# Patient Record
Sex: Female | Born: 1990 | Race: White | Hispanic: No | Marital: Married
Health system: Southern US, Community
[De-identification: ages and names within clinical notes are randomized; demographics above are authoritative.]

## PROBLEM LIST (undated history)

## (undated) DIAGNOSIS — O24419 Gestational diabetes mellitus in pregnancy, unspecified control: Secondary | ICD-10-CM

## (undated) DIAGNOSIS — F419 Anxiety disorder, unspecified: Secondary | ICD-10-CM

## (undated) DIAGNOSIS — O139 Gestational [pregnancy-induced] hypertension without significant proteinuria, unspecified trimester: Secondary | ICD-10-CM

## (undated) HISTORY — PX: TONSILLECTOMY: SUR1361

## (undated) HISTORY — DX: Gestational (pregnancy-induced) hypertension without significant proteinuria, unspecified trimester: O13.9

---

## 2010-07-16 ENCOUNTER — Ambulatory Visit: Payer: Self-pay | Admitting: Diagnostic Radiology

## 2010-07-16 ENCOUNTER — Emergency Department (HOSPITAL_BASED_OUTPATIENT_CLINIC_OR_DEPARTMENT_OTHER): Admission: EM | Admit: 2010-07-16 | Discharge: 2010-07-16 | Payer: Self-pay | Admitting: Emergency Medicine

## 2020-02-09 ENCOUNTER — Ambulatory Visit: Payer: Self-pay | Attending: Internal Medicine

## 2020-02-09 DIAGNOSIS — Z20822 Contact with and (suspected) exposure to covid-19: Secondary | ICD-10-CM

## 2020-02-10 LAB — NOVEL CORONAVIRUS, NAA: SARS-CoV-2, NAA: NOT DETECTED

## 2020-07-03 LAB — OB RESULTS CONSOLE GC/CHLAMYDIA
Chlamydia: NEGATIVE
Gonorrhea: NEGATIVE

## 2020-07-03 LAB — OB RESULTS CONSOLE RUBELLA ANTIBODY, IGM: Rubella: IMMUNE

## 2020-07-03 LAB — OB RESULTS CONSOLE ANTIBODY SCREEN: Antibody Screen: NEGATIVE

## 2020-07-03 LAB — OB RESULTS CONSOLE RPR: RPR: NONREACTIVE

## 2020-07-03 LAB — OB RESULTS CONSOLE HIV ANTIBODY (ROUTINE TESTING): HIV: NONREACTIVE

## 2020-07-03 LAB — OB RESULTS CONSOLE ABO/RH: RH Type: POSITIVE

## 2020-07-03 LAB — OB RESULTS CONSOLE HEPATITIS B SURFACE ANTIGEN: Hepatitis B Surface Ag: NEGATIVE

## 2020-12-06 ENCOUNTER — Ambulatory Visit: Payer: BC Managed Care – PPO

## 2020-12-09 NOTE — L&D Delivery Note (Signed)
Delivery Note At bedside for increased pressure, found to have made rapid change to complete, +1. At 1:23 AM a viable female was delivered via Vaginal, Spontaneous (Presentation:   Occiput Anterior). Tight nuchal noted and delivered through. APGAR: 8, 9; weight pend .   Placenta status: Spontaneous, Intact.  Cord: 3 vessels with the following complications: None.  Uterus explored and cleared of clot. Atony noted and resolved with bimanual massage.  1000 mcg cytotec place PR.  Anesthesia: Epidural Episiotomy: None Lacerations: None Est. Blood Loss (mL): 300  Mom to postpartum.  Baby to Couplet care / Skin to Skin.  Lyn Henri 01/22/2021, 1:47 AM

## 2020-12-11 ENCOUNTER — Encounter: Payer: Self-pay | Admitting: Registered"

## 2020-12-11 ENCOUNTER — Encounter: Payer: BC Managed Care – PPO | Attending: Obstetrics and Gynecology | Admitting: Registered"

## 2020-12-11 DIAGNOSIS — Z3A30 30 weeks gestation of pregnancy: Secondary | ICD-10-CM | POA: Insufficient documentation

## 2020-12-11 DIAGNOSIS — O24419 Gestational diabetes mellitus in pregnancy, unspecified control: Secondary | ICD-10-CM | POA: Diagnosis not present

## 2020-12-11 NOTE — Progress Notes (Signed)
Virtual Visit via Video Note  I connected with Julie Goodman on 12/11/20 at  8:00 AM EST by a video enabled telemedicine application and verified that I am speaking with the correct person using two identifiers. Location: Patient: home Provider: [facility]   I discussed the limitations of evaluation and management by telemedicine and the availability of in person appointments. The patient expressed understanding and agreed to proceed.  Patient was seen on 12/11/20 for Gestational Diabetes self-management via [place]. Due to technical difficulties the visit was switch to phone call. The following learning objectives were met by the patient:   States the definition of Gestational Diabetes  States why dietary management is important in controlling blood glucose  Describes the effects each nutrient has on blood glucose levels  Demonstrates ability to create a balanced meal plan  Demonstrates carbohydrate counting   States when to check blood glucose levels  States proper blood glucose monitoring techniques  States the effect of stress and exercise on blood glucose levels  States the importance of limiting caffeine and abstaining from alcohol and smoking  Blood glucose monitor given: none - virtual appointment  Patient instructed to monitor glucose levels: FBS: 60 - <95; 1 hour: <140; 2 hour: <120  Patient received handouts via email:  Nutrition Diabetes and Pregnancy, including carb counting list  Patient will be seen for follow-up as needed.

## 2021-01-08 LAB — OB RESULTS CONSOLE GBS: GBS: NEGATIVE

## 2021-01-15 ENCOUNTER — Other Ambulatory Visit: Payer: Self-pay

## 2021-01-15 ENCOUNTER — Encounter (HOSPITAL_COMMUNITY): Payer: Self-pay

## 2021-01-15 ENCOUNTER — Inpatient Hospital Stay (HOSPITAL_COMMUNITY)
Admission: AD | Admit: 2021-01-15 | Discharge: 2021-01-15 | Disposition: A | Discharge status: Home / Self Care | Payer: BC Managed Care – PPO | Attending: Obstetrics and Gynecology | Admitting: Obstetrics and Gynecology

## 2021-01-15 DIAGNOSIS — Z79899 Other long term (current) drug therapy: Secondary | ICD-10-CM | POA: Diagnosis not present

## 2021-01-15 DIAGNOSIS — O99891 Other specified diseases and conditions complicating pregnancy: Secondary | ICD-10-CM

## 2021-01-15 DIAGNOSIS — O9A213 Injury, poisoning and certain other consequences of external causes complicating pregnancy, third trimester: Secondary | ICD-10-CM | POA: Diagnosis not present

## 2021-01-15 DIAGNOSIS — W000XXA Fall on same level due to ice and snow, initial encounter: Secondary | ICD-10-CM | POA: Insufficient documentation

## 2021-01-15 DIAGNOSIS — R109 Unspecified abdominal pain: Secondary | ICD-10-CM | POA: Diagnosis present

## 2021-01-15 DIAGNOSIS — O24415 Gestational diabetes mellitus in pregnancy, controlled by oral hypoglycemic drugs: Secondary | ICD-10-CM | POA: Insufficient documentation

## 2021-01-15 DIAGNOSIS — W010XXA Fall on same level from slipping, tripping and stumbling without subsequent striking against object, initial encounter: Secondary | ICD-10-CM

## 2021-01-15 DIAGNOSIS — Z3689 Encounter for other specified antenatal screening: Secondary | ICD-10-CM | POA: Diagnosis not present

## 2021-01-15 DIAGNOSIS — Z3A36 36 weeks gestation of pregnancy: Secondary | ICD-10-CM

## 2021-01-15 DIAGNOSIS — Z7982 Long term (current) use of aspirin: Secondary | ICD-10-CM | POA: Diagnosis not present

## 2021-01-15 DIAGNOSIS — W19XXXA Unspecified fall, initial encounter: Secondary | ICD-10-CM

## 2021-01-15 HISTORY — DX: Gestational (pregnancy-induced) hypertension without significant proteinuria, unspecified trimester: O13.9

## 2021-01-15 HISTORY — DX: Gestational diabetes mellitus in pregnancy, unspecified control: O24.419

## 2021-01-15 LAB — URINALYSIS, ROUTINE W REFLEX MICROSCOPIC
Bilirubin Urine: NEGATIVE
Glucose, UA: NEGATIVE mg/dL
Hgb urine dipstick: NEGATIVE
Ketones, ur: NEGATIVE mg/dL
Leukocytes,Ua: NEGATIVE
Nitrite: NEGATIVE
Protein, ur: NEGATIVE mg/dL
Specific Gravity, Urine: 1.012 (ref 1.005–1.030)
pH: 6 (ref 5.0–8.0)

## 2021-01-15 NOTE — MAU Note (Signed)
Julie Goodman is a 30 y.o. at [redacted]w[redacted]d here in MAU reporting: this morning slipped on the ice while leaving the house. States she did not hit her abdomen but did land on her back. Is having some pelvic pressure but states this has been ongoing and is not new. After the fall she had some cramping but that has stopped, denies bleeding or LOF. +FM. Is on labetalol for GHTN and is scheduled for induction next week.  Onset of complaint: today  Pain score: 0/10  Vitals:   01/15/21 0918  BP: 131/77  Pulse: 91  Resp: 16  Temp: 98.8 F (37.1 C)  SpO2: 100%     FHT: +FM  Lab orders placed from triage: UA

## 2021-01-15 NOTE — MAU Provider Note (Signed)
History     CSN: [id number]  Arrival date and time: 01/15/21 9379   Event Date/Time   First Provider Initiated Contact with Patient 01/15/21 [phone]      Chief Complaint  Patient presents with  . Fall   HPI This is a 30yo G2P1001 at [redacted]w[redacted]d with pregnancy complicated by GDM. She is seen today after slipping on ice and falling at 8am. She had her hands full and was unable to catch herself. She landed on her back and side. No abdominal trauma, bruising, bleeding. Reports good fetal movement.   OB History    Gravida  2   Para  1   Term      Preterm      AB      Living  1     SAB      IAB      Ectopic      Multiple      Live Births  1           Past Medical History:  Diagnosis Date  . Gestational diabetes   . Gestational hypertension     Past Surgical History:  Procedure Laterality Date  . TONSILLECTOMY      Family History  Problem Relation Age of Onset  . Diabetes Father   . Breast cancer Maternal Grandmother   . Heart disease Maternal Grandfather     Social History   Tobacco Use  . Smoking status: Never Smoker  . Smokeless tobacco: Never Used  Vaping Use  . Vaping Use: Never used  Substance Use Topics  . Alcohol use: Not Currently  . Drug use: Never    Allergies:  Allergies  Allergen Reactions  . Penicillins Hives    Medications Prior to Admission  Medication Sig Dispense Refill Last Dose  . Aspirin 81 MG CAPS Take by mouth.   01/14/2021 at Unknown time  . glyBURIDE (DIABETA) 2.5 MG tablet Take 2.5 mg by mouth at bedtime.   01/14/2021 at Unknown time  . labetalol (NORMODYNE) 100 MG tablet Take 100 mg by mouth 2 (two) times daily.   01/15/2021 at Unknown time  . Prenatal Vit-Fe Fumarate-FA (MULTIVITAMIN-PRENATAL) 27-0.8 MG TABS tablet Take 1 tablet by mouth daily at 12 noon.   01/14/2021 at Unknown time  . sertraline (ZOLOFT) 100 MG tablet Take 100 mg by mouth daily.   01/14/2021 at Unknown time    Review of Systems  All other systems reviewed  and are negative.  Physical Exam   Blood pressure 135/77, pulse 90, temperature 98.8 F (37.1 C), temperature source Oral, resp. rate 18, height 5\' 2"  (1.575 m), weight 99.4 kg, SpO2 97 %.  Physical Exam Vitals reviewed.  Constitutional:      Appearance: Normal appearance.  HENT:     Head: Normocephalic and atraumatic.  Cardiovascular:     Rate and Rhythm: Normal rate and regular rhythm.     Pulses: Normal pulses.     Heart sounds: Normal heart sounds.  Pulmonary:     Effort: Pulmonary effort is normal.     Breath sounds: Normal breath sounds.  Abdominal:     General: Abdomen is flat. Bowel sounds are normal. There is no distension.     Palpations: Abdomen is soft. There is no mass.     Tenderness: There is no abdominal tenderness. There is no guarding or rebound.  Skin:    General: Skin is warm and dry.     Capillary Refill: Capillary refill takes less than  2 seconds.  Neurological:     General: No focal deficit present.     Mental Status: She is alert.  Psychiatric:        Mood and Affect: Mood normal.        Behavior: Behavior normal.        Thought Content: Thought content normal.        Judgment: Judgment normal.     MAU Course  Procedures NST: 130, moderate variability, + accels, no decels. No contractions.  MDM   Assessment and Plan  1. [redacted] weeks gestation of pregnancy  2. NST (non-stress test) reactive  3. Fall, initial encounter  Discharge to home. Return precautions given.  Has [place] later today.  Levie Heritage 01/15/2021, 9:56 AM

## 2021-01-17 ENCOUNTER — Telehealth (HOSPITAL_COMMUNITY): Payer: Self-pay | Admitting: *Deleted

## 2021-01-17 ENCOUNTER — Encounter (HOSPITAL_COMMUNITY): Payer: Self-pay | Admitting: *Deleted

## 2021-01-17 NOTE — Telephone Encounter (Signed)
Preadmission screen  

## 2021-01-21 ENCOUNTER — Other Ambulatory Visit: Payer: Self-pay

## 2021-01-21 ENCOUNTER — Inpatient Hospital Stay (HOSPITAL_COMMUNITY): Payer: BC Managed Care – PPO | Admitting: Anesthesiology

## 2021-01-21 ENCOUNTER — Inpatient Hospital Stay (HOSPITAL_COMMUNITY)
Admission: AD | Admit: 2021-01-21 | Discharge: 2021-01-23 | DRG: 807 | Disposition: A | Discharge status: Home / Self Care | Payer: BC Managed Care – PPO | Attending: Obstetrics and Gynecology | Admitting: Obstetrics and Gynecology

## 2021-01-21 ENCOUNTER — Encounter (HOSPITAL_COMMUNITY): Payer: Self-pay | Admitting: Obstetrics and Gynecology

## 2021-01-21 DIAGNOSIS — Z3689 Encounter for other specified antenatal screening: Secondary | ICD-10-CM | POA: Diagnosis not present

## 2021-01-21 DIAGNOSIS — O24415 Gestational diabetes mellitus in pregnancy, controlled by oral hypoglycemic drugs: Secondary | ICD-10-CM

## 2021-01-21 DIAGNOSIS — Z20822 Contact with and (suspected) exposure to covid-19: Secondary | ICD-10-CM | POA: Diagnosis present

## 2021-01-21 DIAGNOSIS — O134 Gestational [pregnancy-induced] hypertension without significant proteinuria, complicating childbirth: Secondary | ICD-10-CM | POA: Diagnosis present

## 2021-01-21 DIAGNOSIS — O24425 Gestational diabetes mellitus in childbirth, controlled by oral hypoglycemic drugs: Secondary | ICD-10-CM | POA: Diagnosis present

## 2021-01-21 DIAGNOSIS — Z3A37 37 weeks gestation of pregnancy: Secondary | ICD-10-CM

## 2021-01-21 DIAGNOSIS — O169 Unspecified maternal hypertension, unspecified trimester: Secondary | ICD-10-CM | POA: Diagnosis present

## 2021-01-21 DIAGNOSIS — O133 Gestational [pregnancy-induced] hypertension without significant proteinuria, third trimester: Secondary | ICD-10-CM | POA: Diagnosis not present

## 2021-01-21 DIAGNOSIS — O24419 Gestational diabetes mellitus in pregnancy, unspecified control: Secondary | ICD-10-CM

## 2021-01-21 HISTORY — DX: Anxiety disorder, unspecified: F41.9

## 2021-01-21 LAB — COMPREHENSIVE METABOLIC PANEL
ALT: 21 U/L (ref 0–44)
AST: 18 U/L (ref 15–41)
Albumin: 2.9 g/dL — ABNORMAL LOW (ref 3.5–5.0)
Alkaline Phosphatase: 237 U/L — ABNORMAL HIGH (ref 38–126)
Anion gap: 12 (ref 5–15)
BUN: 6 mg/dL (ref 6–20)
CO2: 22 mmol/L (ref 22–32)
Calcium: 9.1 mg/dL (ref 8.9–10.3)
Chloride: 101 mmol/L (ref 98–111)
Creatinine, Ser: 0.53 mg/dL (ref 0.44–1.00)
GFR, Estimated: >60 mL/min (ref >60)
Glucose, Bld: 102 mg/dL — ABNORMAL HIGH (ref 70–99)
Potassium: 4.3 mmol/L (ref 3.5–5.1)
Sodium: 135 mmol/L (ref 135–145)
Total Bilirubin: 0.8 mg/dL (ref 0.3–1.2)
Total Protein: 6.9 g/dL (ref 6.5–8.1)

## 2021-01-21 LAB — CBC
HCT: 39.3 % (ref 36.0–46.0)
HCT: 37.1 % (ref 36.0–46.0)
Hemoglobin: 13.3 g/dL (ref 12.0–15.0)
Hemoglobin: 12.1 g/dL (ref 12.0–15.0)
MCH: 31.4 pg (ref 26.0–34.0)
MCH: 30.3 pg (ref 26.0–34.0)
MCHC: 33.8 g/dL (ref 30.0–36.0)
MCHC: 32.6 g/dL (ref 30.0–36.0)
MCV: 92.9 fL (ref 80.0–100.0)
MCV: 93 fL (ref 80.0–100.0)
Platelets: 202 10*3/uL (ref 150–400)
Platelets: 190 10*3/uL (ref 150–400)
RBC: 4.23 MIL/uL (ref 3.87–5.11)
RBC: 3.99 MIL/uL (ref 3.87–5.11)
RDW: 14.1 % (ref 11.5–15.5)
RDW: 13.9 % (ref 11.5–15.5)
WBC: 7.6 10*3/uL (ref 4.0–10.5)
WBC: 9.4 10*3/uL (ref 4.0–10.5)
nRBC: 0 % (ref 0.0–0.2)
nRBC: 0 % (ref 0.0–0.2)

## 2021-01-21 LAB — RESP PANEL BY RT-PCR (FLU A&B, COVID) ARPGX2
Influenza A by PCR: NEGATIVE
Influenza B by PCR: NEGATIVE
SARS Coronavirus 2 by RT PCR: NEGATIVE

## 2021-01-21 LAB — GLUCOSE, CAPILLARY
Glucose-Capillary: 87 mg/dL (ref 70–99)
Glucose-Capillary: 149 mg/dL — ABNORMAL HIGH (ref 70–99)
Glucose-Capillary: 90 mg/dL (ref 70–99)
Glucose-Capillary: 72 mg/dL (ref 70–99)

## 2021-01-21 LAB — PROTEIN / CREATININE RATIO, URINE
Creatinine, Urine: 48.13 mg/dL
Protein Creatinine Ratio: 0.17 mg/mg{Cre} — ABNORMAL HIGH (ref 0.00–0.15)
Total Protein, Urine: 8 mg/dL

## 2021-01-21 LAB — LACTATE DEHYDROGENASE: LDH: 116 U/L (ref 98–192)

## 2021-01-21 LAB — URIC ACID: Uric Acid, Serum: 4.5 mg/dL (ref 2.5–7.1)

## 2021-01-21 LAB — RPR: RPR Ser Ql: NONREACTIVE

## 2021-01-21 LAB — TYPE AND SCREEN
ABO/RH(D): A POS
Antibody Screen: NEGATIVE

## 2021-01-21 MED ORDER — EPHEDRINE 5 MG/ML INJ: 10.0000 mg | INTRAVENOUS | Status: DC | PRN

## 2021-01-21 MED ORDER — PHENYLEPHRINE 40 MCG/ML (10ML) SYRINGE FOR IV PUSH (FOR BLOOD PRESSURE SUPPORT)
80.0000 ug | PREFILLED_SYRINGE | INTRAVENOUS | Status: DC | PRN
  Filled 2021-01-21: qty 10

## 2021-01-21 MED ORDER — TERBUTALINE SULFATE 1 MG/ML IJ SOLN: 0.2500 mg | Freq: Once | INTRAMUSCULAR | Status: DC | PRN

## 2021-01-21 MED ORDER — OXYTOCIN BOLUS FROM INFUSION: 333.0000 mL | Freq: Once | INTRAVENOUS | Status: DC

## 2021-01-21 MED ORDER — PHENYLEPHRINE 40 MCG/ML (10ML) SYRINGE FOR IV PUSH (FOR BLOOD PRESSURE SUPPORT): 80.0000 ug | PREFILLED_SYRINGE | INTRAVENOUS | Status: DC | PRN

## 2021-01-21 MED ORDER — OXYCODONE-ACETAMINOPHEN 5-325 MG PO TABS: 1.0000 | ORAL_TABLET | ORAL | Status: DC | PRN

## 2021-01-21 MED ORDER — ONDANSETRON HCL 4 MG/2ML IJ SOLN: 4.0000 mg | Freq: Four times a day (QID) | INTRAMUSCULAR | Status: DC | PRN

## 2021-01-21 MED ORDER — LABETALOL HCL 100 MG PO TABS
100.0000 mg | ORAL_TABLET | Freq: Two times a day (BID) | ORAL | Status: DC
  Administered 2021-01-21 (×2): 100 mg via ORAL
  Filled 2021-01-21 (×2): qty 1

## 2021-01-21 MED ORDER — OXYTOCIN-SODIUM CHLORIDE 30-0.9 UT/500ML-% IV SOLN
2.5000 [IU]/h | INTRAVENOUS | Status: DC
  Administered 2021-01-22: 2.5 [IU]/h via INTRAVENOUS

## 2021-01-21 MED ORDER — LACTATED RINGERS IV SOLN: INTRAVENOUS | Status: DC

## 2021-01-21 MED ORDER — OXYCODONE-ACETAMINOPHEN 5-325 MG PO TABS: 2.0000 | ORAL_TABLET | ORAL | Status: DC | PRN

## 2021-01-21 MED ORDER — FENTANYL-BUPIVACAINE-NACL 0.5-0.125-0.9 MG/250ML-% EP SOLN
12.0000 mL/h | EPIDURAL | Status: DC | PRN
  Filled 2021-01-21: qty 250

## 2021-01-21 MED ORDER — OXYTOCIN-SODIUM CHLORIDE 30-0.9 UT/500ML-% IV SOLN
1.0000 m[IU]/min | INTRAVENOUS | Status: DC
  Administered 2021-01-21: 2 m[IU]/min via INTRAVENOUS
  Filled 2021-01-21: qty 500

## 2021-01-21 MED ORDER — MISOPROSTOL 25 MCG QUARTER TABLET
25.0000 ug | ORAL_TABLET | ORAL | Status: DC | PRN
  Administered 2021-01-21 (×2): 25 ug via VAGINAL
  Filled 2021-01-21 (×2): qty 1

## 2021-01-21 MED ORDER — LACTATED RINGERS IV SOLN
500.0000 mL | Freq: Once | INTRAVENOUS | Status: AC
  Administered 2021-01-21: 500 mL via INTRAVENOUS

## 2021-01-21 MED ORDER — BUTORPHANOL TARTRATE 1 MG/ML IJ SOLN: 1.0000 mg | INTRAMUSCULAR | Status: DC | PRN

## 2021-01-21 MED ORDER — SOD CITRATE-CITRIC ACID 500-334 MG/5ML PO SOLN: 30.0000 mL | ORAL | Status: DC | PRN

## 2021-01-21 MED ORDER — LIDOCAINE HCL (PF) 1 % IJ SOLN
INTRAMUSCULAR | Status: DC | PRN
  Administered 2021-01-21: 5 mL via EPIDURAL

## 2021-01-21 MED ORDER — LIDOCAINE HCL (PF) 1 % IJ SOLN: 30.0000 mL | INTRAMUSCULAR | Status: DC | PRN

## 2021-01-21 MED ORDER — DIPHENHYDRAMINE HCL 50 MG/ML IJ SOLN: 12.5000 mg | INTRAMUSCULAR | Status: DC | PRN

## 2021-01-21 MED ORDER — FENTANYL-BUPIVACAINE-NACL 0.5-0.125-0.9 MG/250ML-% EP SOLN
EPIDURAL | Status: DC | PRN
  Administered 2021-01-21: 12 mL/h via EPIDURAL

## 2021-01-21 MED ORDER — SERTRALINE HCL 100 MG PO TABS
100.0000 mg | ORAL_TABLET | Freq: Every day | ORAL | Status: DC
  Administered 2021-01-21: 100 mg via ORAL
  Filled 2021-01-21: qty 1

## 2021-01-21 MED ORDER — LACTATED RINGERS IV SOLN: 500.0000 mL | INTRAVENOUS | Status: DC | PRN

## 2021-01-21 MED ORDER — HYDROXYZINE HCL 50 MG PO TABS: 50.0000 mg | ORAL_TABLET | Freq: Four times a day (QID) | ORAL | Status: DC | PRN

## 2021-01-21 MED ORDER — ACETAMINOPHEN 325 MG PO TABS: 650.0000 mg | ORAL_TABLET | ORAL | Status: DC | PRN

## 2021-01-21 NOTE — Anesthesia Procedure Notes (Signed)

## 2021-01-21 NOTE — MAU Note (Signed)
[name] is a 30 y.o. at [redacted]w[redacted]d here in MAU reporting: lower back and abdominal cramping since last night at 2300, states cramping is constant but sometimes the intensity changes. No bleeding or LOF. +FM  Onset of complaint: last night  Pain score: 3/10  Vitals:   01/21/21 0735  BP: (!) 146/82  Pulse: 93  Resp: 18  Temp: 98.8 F (37.1 C)  SpO2: 100%     Is on labetalol 100mg  2x daily but did not take dose last night or this AM due to pain  FHT: +FM  Lab orders placed from triage: none

## 2021-01-21 NOTE — Anesthesia Preprocedure Evaluation (Signed)
Anesthesia Evaluation  Patient identified by MRN, date of birth, ID band Patient awake    Reviewed: Allergy & Precautions, NPO status , Patient's Chart, lab work & pertinent test results  Airway Mallampati: II  TM Distance: >3 FB Neck ROM: Full    Dental no notable dental hx. (+) Teeth Intact, Dental Advisory Given   Pulmonary neg pulmonary ROS,    Pulmonary exam normal breath sounds clear to auscultation       Cardiovascular hypertension, Pt. on medications Normal cardiovascular exam Rhythm:Regular Rate:Normal     Neuro/Psych Anxiety negative neurological ROS     GI/Hepatic negative GI ROS, Neg liver ROS,   Endo/Other  diabetes  Renal/GU negative Renal ROS     Musculoskeletal   Abdominal (+) + obese,   Peds  Hematology Lab Results      Component                Value               Date                      WBC                      9.4                 01/21/2021                HGB                      12.1                01/21/2021                HCT                      37.1                01/21/2021                MCV                      93.0                01/21/2021                PLT                      190                 01/21/2021              Anesthesia Other Findings   Reproductive/Obstetrics (+) Pregnancy                             Anesthesia Physical Anesthesia Plan  ASA: III  Anesthesia Plan: Epidural   Post-op Pain Management:    Induction:   PONV Risk Score and Plan:   Airway Management Planned:   Additional Equipment:   Intra-op Plan:   Post-operative Plan:   Informed Consent: I have reviewed the patients History and Physical, chart, labs and discussed the procedure including the risks, benefits and alternatives for the proposed anesthesia with the patient or authorized representative who has indicated his/her understanding and acceptance.        Plan Discussed with:  Anesthesia Plan Comments: (37.2 G2P1 w gHth and gDM)        Anesthesia Quick Evaluation

## 2021-01-21 NOTE — H&P (Signed)
OB History and Physical   Julie Goodman is a 30 y.o. female G2P1001 presenting for cramping at [redacted]w[redacted]d.  Pregnancy course is notable for gestational hypertension on 100 mg labetalol BID as well as gestational diabetes, recently started on glyburide. She has a history of 38 week induction for PIH.  She presents with constant, cramping pain overnight.  She denies headache, vision changes, RUQ or epigastric pain.  She missed her evening medications last night due to ongoing pain.  She last took aspirin on 2/11.  Rh positive, GBS negative, baby girl.    OB History    Gravida  2   Para  1   Term      Preterm      AB      Living  1     SAB      IAB      Ectopic      Multiple      Live Births  1          Past Medical History:  Diagnosis Date  . Gestational diabetes    glyburide  . Gestational hypertension   . Pregnancy induced hypertension    Past Surgical History:  Procedure Laterality Date  . TONSILLECTOMY     Family History: family history includes Breast cancer in her maternal grandmother; Diabetes in her father; Heart disease in her maternal grandfather. Social History:  reports that she has never smoked. She has never used smokeless tobacco. She reports previous alcohol use. She reports that she does not use drugs.     Maternal Diabetes: Yes:  Diabetes Type:  Insulin/Medication controlled Genetic Screening: Normal Maternal Ultrasounds/Referrals: Normal Fetal Ultrasounds or other Referrals:  None Maternal Substance Abuse:  No Significant Maternal Medications:  Labetalol, glyburide, ASA Significant Maternal Lab Results:  Group B Strep negative Other Comments:  None  Review of Systems History Dilation: 1 Effacement (%): 50 Station: -3 Exam by:: Marvel Plan RN Blood pressure (!) 151/94, pulse (!) 108, temperature 98.8 F (37.1 C), temperature source Oral, resp. rate 18, height 5\' 2"  (1.575 m), weight 99.3 kg, SpO2 100 %. Exam Physical Exam   Prenatal labs: ABO, Rh: A/Positive/-- (07/26 0000) Antibody: Negative (07/26 0000) Rubella: Immune (07/26 0000) RPR: Nonreactive (07/26 0000)  HBsAg: Negative (07/26 0000)  HIV: Non-reactive (07/26 0000)  GBS:     Assessment/Plan: . Patient has planned IOL scheduled 01/26/21.  Given presentation today after 37 weeks, ongoing pain, continued elevated BP,  I offered IOL today and patient accepts. o Admit to Labor and Delivery for gestational HTN on medications and A2GDM . Cytotec for cervical ripening, followed by pitocin, AROM\ . EFW 86%ile at 35 weeks . HELLP labs on admission . Last ASA81 dose greater than 24 hrs ago . q4 BG checks for A2GDM on glyburide. 01/28/21 Epidural when desired . Anticipate vaginal delivery.   Marland Kitchen 01/21/2021, 9:09 AM

## 2021-01-21 NOTE — MAU Provider Note (Signed)
Event Date/Time  First Provider Initiated Contact with Patient 01/21/21 0815     S: Ms. Julie Goodman is a 30 y.o. G2P1 at [redacted]w[redacted]d  who presents to MAU today complaining cramping since last night. She is unsure if this is ctx. She denies vaginal bleeding. She denies LOF. She reports normal fetal movement. She's been taking Labetalol 100 mg bid since 35 weeks for gHTN, she has not taken this am. She is also A2GDM on Glyburide.   O: BP (!) 151/94   Pulse (!) 108   Temp 98.8 F (37.1 C) (Oral)   Resp 18   Ht 5\' 2"  (1.575 m)   Wt 99.3 kg   SpO2 100%   BMI 40.06 kg/m   Patient Vitals for the past 24 hrs:  BP Temp Temp src Pulse Resp SpO2 Height Weight  01/21/21 0846 (!) 151/94 -- -- (!) 108 -- 100 % -- --  01/21/21 0830 136/89 -- -- 95 -- 99 % -- --  01/21/21 0818 137/84 -- -- (!) 108 -- -- -- --  01/21/21 0801 (!) 143/82 -- -- 94 -- 95 % -- --  01/21/21 0755 (!) 151/92 -- -- 95 -- 99 % -- --  01/21/21 0735 (!) 146/82 98.8 F (37.1 C) Oral 93 18 100 % -- --  01/21/21 0731 -- -- -- -- -- -- 5\' 2"  (1.575 m) 99.3 kg   GENERAL: Well-developed, well-nourished female in no acute distress.  HEAD: Normocephalic, atraumatic.  CHEST: Normal effort of breathing, regular heart rate ABDOMEN: Soft, nontender, gravid GU: Cervical exam:  Dilation: 1 Effacement (%): 50 Cervical Position: Posterior Station: -3 Presentation: Vertex Exam by:: RN Fetal Monitoring: Baseline: 135 Variability: mod Accelerations: + Decelerations: no Contractions: irreg  MDM: Review of prenatal records from [facility] shows pregnancy is complicated by A2GDM, gHTN and hx in last pregnancy, and anxiety. No signs of labor although recommend IOL for gHTN after 37 wks. Dr. [id number] informed of presentation, clinical findings and recommendation and will come discuss with patient.  A: 1. [redacted] weeks gestation of pregnancy   2. Gestational hypertension, third trimester   3. GDM, class A2   4. NST (non-stress test)  reactive    P: Admit to LD for IOL Mngt per Dr. Marvel Plan, [place], CNM 01/21/2021 9:00 AM

## 2021-01-21 NOTE — Progress Notes (Signed)
Labor Progress Note  Patient now comfortable with epidural.  Cervix 6/70/-2, head well applied.  AROM performed in usual fashion with return of clear fluid.  FHT category 1, accels present.  Toco q3 min.  Pitocin currently 12 mU/min.  Continue current management, anticipate vaginal delivery.

## 2021-01-21 NOTE — Progress Notes (Signed)
Labor Progress Note  Patient doing well, now s/p 2x doses cytotec with progression to 3-4/60/-2.  Head is slightly ballotable and AROM deferred.  Will transition to pitocin and titrate 2x2.  Last BG 90. BP well controlled.   FHT cat 1  Planning epidural. Will continue current mgmt and anticipate vaginal delivery  Julie Simmer MD

## 2021-01-22 ENCOUNTER — Encounter (HOSPITAL_COMMUNITY): Payer: Self-pay | Admitting: Obstetrics and Gynecology

## 2021-01-22 LAB — CBC
HCT: 35.2 % — ABNORMAL LOW (ref 36.0–46.0)
Hemoglobin: 11.7 g/dL — ABNORMAL LOW (ref 12.0–15.0)
MCH: 30.8 pg (ref 26.0–34.0)
MCHC: 33.2 g/dL (ref 30.0–36.0)
MCV: 92.6 fL (ref 80.0–100.0)
Platelets: 195 10*3/uL (ref 150–400)
RBC: 3.8 MIL/uL — ABNORMAL LOW (ref 3.87–5.11)
RDW: 13.6 % (ref 11.5–15.5)
WBC: 14.7 10*3/uL — ABNORMAL HIGH (ref 4.0–10.5)
nRBC: 0 % (ref 0.0–0.2)

## 2021-01-22 LAB — GLUCOSE, CAPILLARY: Glucose-Capillary: 99 mg/dL (ref 70–99)

## 2021-01-22 MED ORDER — ACETAMINOPHEN 325 MG PO TABS: 650.0000 mg | ORAL_TABLET | ORAL | Status: DC | PRN

## 2021-01-22 MED ORDER — WITCH HAZEL-GLYCERIN EX PADS: 1.0000 "application " | MEDICATED_PAD | CUTANEOUS | Status: DC | PRN

## 2021-01-22 MED ORDER — SIMETHICONE 80 MG PO CHEW: 80.0000 mg | CHEWABLE_TABLET | ORAL | Status: DC | PRN

## 2021-01-22 MED ORDER — IBUPROFEN 600 MG PO TABS
600.0000 mg | ORAL_TABLET | Freq: Four times a day (QID) | ORAL | Status: DC
  Administered 2021-01-22 – 2021-01-23 (×6): 600 mg via ORAL
  Filled 2021-01-22 (×6): qty 1

## 2021-01-22 MED ORDER — MISOPROSTOL 200 MCG PO TABS
1000.0000 ug | ORAL_TABLET | Freq: Once | ORAL | Status: AC
  Administered 2021-01-22: 1000 ug via RECTAL

## 2021-01-22 MED ORDER — MISOPROSTOL 200 MCG PO TABS
ORAL_TABLET | ORAL | Status: AC
  Filled 2021-01-22: qty 5

## 2021-01-22 MED ORDER — ONDANSETRON HCL 4 MG/2ML IJ SOLN: 4.0000 mg | INTRAMUSCULAR | Status: DC | PRN

## 2021-01-22 MED ORDER — SENNOSIDES-DOCUSATE SODIUM 8.6-50 MG PO TABS
2.0000 | ORAL_TABLET | ORAL | Status: DC
  Filled 2021-01-22: qty 2

## 2021-01-22 MED ORDER — DIPHENHYDRAMINE HCL 25 MG PO CAPS
25.0000 mg | ORAL_CAPSULE | Freq: Four times a day (QID) | ORAL | Status: DC | PRN
Start: 2021-01-22 — End: 2021-01-23

## 2021-01-22 MED ORDER — ONDANSETRON HCL 4 MG PO TABS: 4.0000 mg | ORAL_TABLET | ORAL | Status: DC | PRN

## 2021-01-22 MED ORDER — DIBUCAINE (PERIANAL) 1 % EX OINT: 1.0000 "application " | TOPICAL_OINTMENT | CUTANEOUS | Status: DC | PRN

## 2021-01-22 MED ORDER — BENZOCAINE-MENTHOL 20-0.5 % EX AERO: 1.0000 "application " | INHALATION_SPRAY | CUTANEOUS | Status: DC | PRN

## 2021-01-22 MED ORDER — TETANUS-DIPHTH-ACELL PERTUSSIS 5-2.5-18.5 LF-MCG/0.5 IM SUSY: 0.5000 mL | PREFILLED_SYRINGE | Freq: Once | INTRAMUSCULAR | Status: DC

## 2021-01-22 MED ORDER — PRENATAL MULTIVITAMIN CH
1.0000 | ORAL_TABLET | Freq: Every day | ORAL | Status: DC
  Administered 2021-01-22 – 2021-01-23 (×2): 1 via ORAL
  Filled 2021-01-22 (×2): qty 1

## 2021-01-22 MED ORDER — COCONUT OIL OIL: 1.0000 "application " | TOPICAL_OIL | Status: DC | PRN

## 2021-01-22 MED ORDER — ZOLPIDEM TARTRATE 5 MG PO TABS: 5.0000 mg | ORAL_TABLET | Freq: Every evening | ORAL | Status: DC | PRN

## 2021-01-22 NOTE — Lactation Note (Signed)
This note was copied from a baby's chart. Lactation Consultation Note  Patient Name: Julie Goodman SEGBT'D Date: 01/22/2021 Reason for consult: Follow-up assessment Age:30 hours  LC back to room to set up DEBP per mother's request. DEBP has been proactively set up prior to [facility] follow up visit. Mother states she is collecting more when using manual breast pump.  Mother reports infant is taking formula well with a bottle and EBM available is getting mixed with formula. LC encouraged mother to offer EBM first via finger and/or spoon and then provide formula.  Last feeding infant took ~48mL of formula with Dr. Theora Gianotti nipple. Discussed volumes of supplementation according to age. LC provided guideline for reference.   Feeding plan:  1. Breastfeed following hunger cues.  2. Offer breast 8 - 12 times in 24h period to establish good milk supply.   3. Pump or hand-express and offer EBM prior to formula supplementation. 4. If needed supplement with formula following guidelines, paced bottle feeding and fullness cues.   5. Encouraged maternal rest, hydration and food intake.  6. Contact Lactation Services or local resources for support, questions or concerns.      Feeding Mother's Current Feeding Choice: Breast Milk and Formula  Interventions Interventions: Breast feeding basics reviewed;Expressed milk;Education  Consult Status Consult Status: Follow-up Date: 01/23/21 Follow-up type: In-patient    Geraldo Haris A Higuera Ancidey 01/22/2021, 6:07 PM

## 2021-01-22 NOTE — Social Work (Signed)
MOB was referred for history of anxiety.   * Referral screened out by Clinical Social Worker because none of the following criteria appear to apply:  ~ History of anxiety/depression during this pregnancy, or of post-partum depression following prior delivery. ~ Diagnosis of anxiety and/or depression within last 3 years. OR * MOB's symptoms currently being treated with medication and/or therapy. MOB has an active prescription for Zoloft 100 mg.   Please contact the Clinical Social Worker if needs arise, by MOB request, or if MOB scores greater than 9/yes to question 10 on Edinburgh Postpartum Depression Screen.  Kobie Matkins, LCSWA Clinical Social Work [facility]  [phone] 

## 2021-01-22 NOTE — Lactation Note (Signed)
This note was copied from a baby's chart. Lactation Consultation Note  Patient Name: Julie Goodman Date: 01/22/2021 Reason for consult: Mother's request Age:30 hours   LP called for lactation. Upon entering the room we observed that LP was tearful and concerned. LP stated that she wanted [place] to attempt to spoon feed baby EBM.   LC swaddled baby and assisted LP in sitting baby in an upright position to spoon feed. LS used a gloved finger to try to get baby to suckle, but baby was unable to form a seal and there were little to no rhythmic sucks.   After repeated attempts by [facility] and LS, it was determined that baby was unable to feed at this time.   Julie Goodman 01/22/2021, 10:01 AM

## 2021-01-22 NOTE — Progress Notes (Signed)
Post Partum Day 0 s/p IOL s/s gHTN without severe features.  Subjective: no complaints, up ad lib, voiding and tolerating PO  Objective: Blood pressure 138/76, pulse 87, temperature 98.8 F (37.1 C), temperature source Oral, resp. rate 18, height 5\' 2"  (1.575 m), weight 99.3 kg, SpO2 99 %, unknown if currently breastfeeding.  Physical Exam:  General: alert Lochia: appropriate Uterine Fundus: firm Incision: n/a DVT Evaluation: No evidence of DVT seen on physical exam.  Recent Labs    01/21/21 1844 01/22/21 0402  HGB 12.1 11.7*  HCT 37.1 35.2*    Assessment/Plan: Plan for discharge tomorrow and Breastfeeding. BP normal to mild range.    LOS: 1 day   01/24/21 01/22/2021, 10:13 AM

## 2021-01-22 NOTE — Anesthesia Postprocedure Evaluation (Signed)
Anesthesia Post Note  Patient: Julie Goodman  Procedure(s) Performed: AN AD HOC LABOR EPIDURAL     Patient location during evaluation: Mother Baby Anesthesia Type: Epidural Level of consciousness: awake and alert Pain management: pain level controlled Vital Signs Assessment: post-procedure vital signs reviewed and stable Respiratory status: spontaneous breathing, nonlabored ventilation and respiratory function stable Cardiovascular status: stable Postop Assessment: no headache, no backache and epidural receding Anesthetic complications: no   No complications documented.  Last Vitals:  Vitals:   01/22/21 0415 01/22/21 0803  BP: 124/62 138/76  Pulse: 100 87  Resp: 16 18  Temp: 37.5 C 37.1 C  SpO2:      Last Pain:  Vitals:   01/22/21 0803  TempSrc: Oral  PainSc:    Pain Goal:                   [name]

## 2021-01-22 NOTE — Lactation Note (Signed)
This note was copied from a baby's chart. Lactation Consultation Note  Patient Name: Girl Katniss Weedman Today's Date: 01/22/2021 Reason for consult: Initial assessment;Early term 37-38.6wks Age:30 hours  Maternal Data Has patient been taught Hand Expression?: Yes Does the patient have breastfeeding experience prior to this delivery?: Yes How long did the patient breastfeed?: 6 months     LATCH Score Latch: Grasps breast easily, tongue down, lips flanged, rhythmical sucking. (suckling interrupted by grunting)  Audible Swallowing: None  Type of Nipple: Everted at rest and after stimulation  Comfort (Breast/Nipple): Soft / non-tender  Hold (Positioning): Assistance needed to correctly position infant at breast and maintain latch.  LATCH Score: 7   Lactating parent (LP) was breastfeeding upon entry to the room. Baby was at breast and Lactation Student (LS) asked Lactating Parent how things were going with breastfeeding. LP stated that things were going well.   LS noticed that mom was leaning over to feed baby and that support was needed to help LP become more comfortable. LS assisted LP with repositioning baby and adding support pillows to bring baby closer to the breast. LP stated that she was more comfortable.   Baby latched well, but was unable to sustain the latch due to grunting.   LP stated that baby was grunting during all of her previous feedings as well.   LP stated that her older daughter was born at 19 weeks and she was able to breastfeed her via pumping for 6 months.   LC talked to LP about how early term babies perform at the breast. She informed LP that baby may be more sleepy and tire easily at the breast until her expected due date. LC also informed LP that since baby is grunting at the breast and not transferring well, we should hand express milk to protect her supply and feed baby via a spoon.   LS assisted LP in hand expressing into a vial and educated her on  milk storage guidelines. LS also gave LP a manual pump and taught her how to assemble the pump, use the pump, and how to clean her pump parts. We observed her briefly and size 24 flange is ok at this time.   LP will pump to protect her milk supply.  LC encouraged LP to call for lactation when baby is cueing so that we can assist her with latching and spoon feeding baby.     Lindsi Bayliss 01/22/2021, 9:35 AM

## 2021-01-23 MED ORDER — IBUPROFEN 600 MG PO TABS: 600.0000 mg | ORAL_TABLET | Freq: Four times a day (QID) | ORAL | 0 refills | Status: AC | PRN

## 2021-01-23 MED ORDER — ACETAMINOPHEN 325 MG PO TABS: 650.0000 mg | ORAL_TABLET | Freq: Four times a day (QID) | ORAL | 0 refills | Status: AC | PRN

## 2021-01-23 NOTE — Lactation Note (Signed)
This note was copied from a baby's chart. Lactation Consultation Note  Patient Name: Julie Goodman [id number] Date: 01/23/2021 Reason for consult: Follow-up assessment Age:30 hours   P2 mother whose infant is now 76 hours old.  This is an ETI at 37+3 weeks.  Mother has been breast feeding and has supplemented with formula a couple of times.  Family would like to be discharged home this afternoon.  Mother had baby latched to the breast when I arrived, however, she was sleepy.  Offered to assist/observe with latching and mother agreeable.  Suggested mother remove sleeper and awaken baby.  After baby was more awake mother latched her to the breast again.  I provided helpful advice on how to obtain a deep latch and how to gently stimulate baby to keep her awake.  Mother noticed the change in feeding desire and baby began actively sucking.  Reviewed breast feeding basics and observed baby feeding for 15 minutes.  During this time period, SLP in the room also for observation.  Mother had not been supplementing after every feeding.  Suggested she begin to do this and reviewed the LPTI supplementation guidelines.  Father provided the supplementation while I assisted mother with pumping.  I also suggested mother pump for 15 minutes after every feeding.  Mother willing to do this.  Changed her flange size to a #27 for greater fit and comfort.  Irving Burton, SLP, observed baby feeding and will send up a Dr. Theora Gianotti preemie nipple for parents to use.  Baby had no further grunting or any distress at all during her feeding.  She breast fed well and calmly and consumed 20 mls of formula easily.  Advised parents to feed within the guidelines but to allow her to consume more as desired.  Mother feels relieved and is very happy to see her daughter progressing well.  She is ready for discharge if the pediatrician allows.  Report given to NP who will pass information on to the MD on service.   Maternal Data     Feeding Nipple Type: Dr. Levert Feinstein Preemie  LATCH Score Latch: Grasps breast easily, tongue down, lips flanged, rhythmical sucking.  Audible Swallowing: A few with stimulation  Type of Nipple: Everted at rest and after stimulation  Comfort (Breast/Nipple): Soft / non-tender  Hold (Positioning): Assistance needed to correctly position infant at breast and maintain latch.  LATCH Score: 8   Lactation Tools Discussed/Used    Interventions Interventions: Breast feeding basics reviewed;Assisted with latch;Skin to skin;Breast massage;Hand express;Breast compression;Adjust position;DEBP;Hand pump;Position options;Support pillows;Education  Discharge    Consult Status Consult Status: Follow-up Date: 01/24/21 Follow-up type: In-patient    Zakhai Meisinger R Mikita Lesmeister 01/23/2021, 1:29 PM

## 2021-01-23 NOTE — Discharge Summary (Signed)
Postpartum Discharge Summary  Date of Service updated 01/23/21     Patient Name: Julie Goodman DOB: 1991/09/24 MRN: [id number]  Date of admission: 01/21/2021 Delivery date:01/22/2021  Delivering provider: Eyvonne Mechanic A  Date of discharge: 01/23/2021  Admitting diagnosis: Hypertension in pregnancy [O16.9] Intrauterine pregnancy: [redacted]w[redacted]d    Secondary diagnosis:  Active Problems:   Hypertension in pregnancy  Additional problems: gestational diabetes    Discharge diagnosis: Term Pregnancy Delivered, Gestational Hypertension and GDM A2                                              Post partum procedures: Augmentation: AROM, Pitocin and Cytotec Complications: None  Hospital course: Induction of Labor With Vaginal Delivery   30y.o. yo G2P1002 at 394w3das admitted to the hospital 01/21/2021 for induction of labor.  Indication for induction: Gestational hypertension and A2 DM.  Patient had an uncomplicated labor course as follows: Membrane Rupture Time/Date: 9:58 PM ,01/21/2021   Delivery Method:Vaginal, Spontaneous  Episiotomy: None  Lacerations:  None  Details of delivery can be found in separate delivery note.  Patient had a routine postpartum course. Patient is discharged home 01/23/21.  Newborn Data: Birth date:01/22/2021  Birth time:1:23 AM  Gender:Female  Living status:Living  Apgars:8 ,9  Weight:3269 g   Magnesium Sulfate received: No BMZ received: No Rhophylac:No MMR:No T-DaP:Given prenatally Flu: No Transfusion:No  Physical exam  Vitals:   01/22/21 0803 01/22/21 1143 01/22/21 2216 01/23/21 0536  BP: 138/76 131/81 138/80 128/86  Pulse: 87 75 92 76  Resp: _0 Temp: 98.8 F (37.1 C) 98 F (36.7 C) 98.8 F (37.1 C) 97.7 F (36.5 C)  TempSrc: Oral  Oral Oral  SpO2:  99% 98%   Weight:      Height:       General: alert, cooperative and no distress Lochia: appropriate Uterine Fundus: firm Incision: Healing well with no significant drainage DVT  Evaluation: No evidence of DVT seen on physical exam. Labs: Lab Results  Component Value Date   WBC 14.7 (H) 01/22/2021   HGB 11.7 (L) 01/22/2021   HCT 35.2 (L) 01/22/2021   MCV 92.6 01/22/2021   PLT 195 01/22/2021   CMP Latest Ref Rng & Units 01/21/2021  Glucose 70 - 99 mg/dL 102(H)  BUN 6 - 20 mg/dL 6  Creatinine 0.44 - 1.00 mg/dL 0.53  Sodium 135 - 145 mmol/L 135  Potassium 3.5 - 5.1 mmol/L 4.3  Chloride 98 - 111 mmol/L 101  CO2 22 - 32 mmol/L 22  Calcium 8.9 - 10.3 mg/dL 9.1  Total Protein 6.5 - 8.1 g/dL 6.9  Total Bilirubin 0.3 - 1.2 mg/dL 0.8  Alkaline Phos 38 - 126 U/L 237(H)  AST 15 - 41 U/L 18  ALT 0 - 44 U/L 21   Edinburgh Score: No flowsheet data found.    After visit meds:  Allergies as of 01/23/2021      Reactions   Penicillins Hives      Medication List    STOP taking these medications   aspirin EC 81 MG tablet   glyBURIDE 2.5 MG tablet Commonly known as: DIABETA   labetalol 100 MG tablet Commonly known as: NORMODYNE     TAKE these medications   acetaminophen 325 MG tablet Commonly known as: Tylenol Take 2 tablets (650 mg total) by mouth  every 6 (six) hours as needed (for pain scale < 4).   ibuprofen 600 MG tablet Commonly known as: ADVIL Take 1 tablet (600 mg total) by mouth every 6 (six) hours as needed.   prenatal multivitamin Tabs tablet Take 1 tablet by mouth at bedtime.   sertraline 100 MG tablet Commonly known as: ZOLOFT Take 100 mg by mouth daily.        Discharge home in stable condition Infant Feeding: Breast Infant Disposition:home with mother Discharge instruction: per After Visit Summary and Postpartum booklet. Activity: Advance as tolerated. Pelvic rest for 6 weeks.  Diet: routine diet Anticipated Birth Control: Unsure Postpartum Appointment:6 weeks Additional Postpartum F/U: BP check 1 week Future Appointments: Future Appointments  Date Time Provider [place]  01/24/2021 10:00 AM [facility]  None   Follow up Visit:      01/23/2021 Allena Katz, MD

## 2021-01-23 NOTE — Lactation Note (Signed)
This note was copied from a baby's chart. Lactation Consultation Note  Patient Name: Julie Goodman [id number] Date: 01/23/2021 Reason for consult: Follow-up assessment Age:30 hours   LC Follow Up Visit:  Attempted to visit with mother, however, she was in the shower.  Father will have RN call me when mother is available.   Maternal Data    Feeding    LATCH Score                    Lactation Tools Discussed/Used    Interventions    Discharge    Consult Status Consult Status: Follow-up Date: 01/23/21 Follow-up type: In-patient    [name] R [name] 01/23/2021, 10:40 AM

## 2021-01-23 NOTE — Discharge Instructions (Signed)
Call office for blood pressure check in 1 week

## 2021-01-24 ENCOUNTER — Other Ambulatory Visit (HOSPITAL_COMMUNITY): Payer: BC Managed Care – PPO

## 2021-01-26 ENCOUNTER — Inpatient Hospital Stay (HOSPITAL_COMMUNITY): Payer: BC Managed Care – PPO

## 2021-01-26 ENCOUNTER — Inpatient Hospital Stay (HOSPITAL_COMMUNITY)
Admission: AD | Admit: 2021-01-26 | Payer: BC Managed Care – PPO | Source: Home / Self Care | Admitting: Obstetrics & Gynecology

## 2021-10-03 ENCOUNTER — Ambulatory Visit (INDEPENDENT_AMBULATORY_CARE_PROVIDER_SITE_OTHER): Payer: BC Managed Care – PPO | Admitting: Sports Medicine

## 2021-10-03 ENCOUNTER — Ambulatory Visit (INDEPENDENT_AMBULATORY_CARE_PROVIDER_SITE_OTHER): Payer: BC Managed Care – PPO

## 2021-10-03 ENCOUNTER — Other Ambulatory Visit: Payer: Self-pay

## 2021-10-03 VITALS — BP 128/84 | HR 80 | Ht 62.0 in | Wt 197.0 lb

## 2021-10-03 DIAGNOSIS — S93401A Sprain of unspecified ligament of right ankle, initial encounter: Secondary | ICD-10-CM | POA: Diagnosis not present

## 2021-10-03 DIAGNOSIS — M25571 Pain in right ankle and joints of right foot: Secondary | ICD-10-CM

## 2021-10-03 NOTE — Patient Instructions (Signed)
Do prescribed exercises at least 3x a week  Follow up as needed 

## 2021-10-03 NOTE — Progress Notes (Signed)
    Aleen Sells D.[facility] [address] [place] [address] Phone: [phone]   Assessment and Plan:     1. Pain in joint involving right ankle and foot 2. Inversion sprain of right ankle, initial encounter -Acute, improving, initial sports medicine visit - Inversion ankle injury likely of ATFL without fracture - Patient has no pain with ambulation, so she does not need to use brace during the day - May use Tylenol/NSAIDs as needed for pain control - Start HEP to prevent reinjury.  Handout provided and instructions given -X-ray obtained in clinic.  My interpretation: No acute fracture, joint space maintained, unremarkable ankle x-ray  Pertinent previous records reviewed include none   Follow Up: As needed if no improvement or worsening of symptoms   Subjective:    Chief Complaint: right ankle injury   HPI:   10/03/21 Patient is a 30 year old female presenting with right ankle pain from an injury sustained on 09/27/21. Walking down steps and stepped into a whole. Happened a week ago. Felt a pop and still swollen. Walking no pain. Active inversion and eversion causes pain. Pain over ATFL. Right foot.   Relevant Historical Information: None pertinent  Additional pertinent review of systems negative.   Current Outpatient Medications:    acetaminophen (TYLENOL) 325 MG tablet, Take 2 tablets (650 mg total) by mouth every 6 (six) hours as needed (for pain scale < 4)., Disp: 60 tablet, Rfl: 0   ibuprofen (ADVIL) 600 MG tablet, Take 1 tablet (600 mg total) by mouth every 6 (six) hours as needed., Disp: 60 tablet, Rfl: 0   Prenatal Vit-Fe Fumarate-FA (PRENATAL MULTIVITAMIN) TABS tablet, Take 1 tablet by mouth at bedtime., Disp: , Rfl:    sertraline (ZOLOFT) 100 MG tablet, Take 100 mg by mouth daily., Disp: , Rfl:    Objective:     Vitals:   10/03/21 1548  BP: 128/84  Pulse: 80  SpO2: 99%  Weight: 197 lb (89.4 kg)  Height: 5\' 2"   (1.575 m)      Body mass index is 36.03 kg/m.    Physical Exam:    Gen: Appears well, nad, nontoxic and pleasant Psych: Alert and oriented, appropriate mood and affect Neuro: sensation intact, strength is 5/5 with df/pf/inv/ev, muscle tone wnl Skin: no susupicious lesions or rashes  Ankle: no deformity Mild swelling laterally however lateral malleolus TTP lateral malleolus, ATFL NTTP over fibular head, medial mal, achilles, navicular, base of 5th, CFL, deltoid, calcaneous or midfoot ROM DF 30, PF 45, inv/ev intact, however painful with dorsiflexion and inversion Negative ant drawer, talar tilt, rotation test, squeeze test. Neg thompson pain with resisted eversion No pain with resisted inversion   Electronically signed by:  D.[facility] 4:32 PM 10/03/21

## 2022-01-28 IMAGING — DX DG ANKLE COMPLETE 3+V*R*
3 series · 3 of 3 positions shown · non-contrast
Comparison: None.

CLINICAL DATA: Right ankle pain

EXAM:
RIGHT ANKLE - COMPLETE 3+ VIEW

[ankle ap]
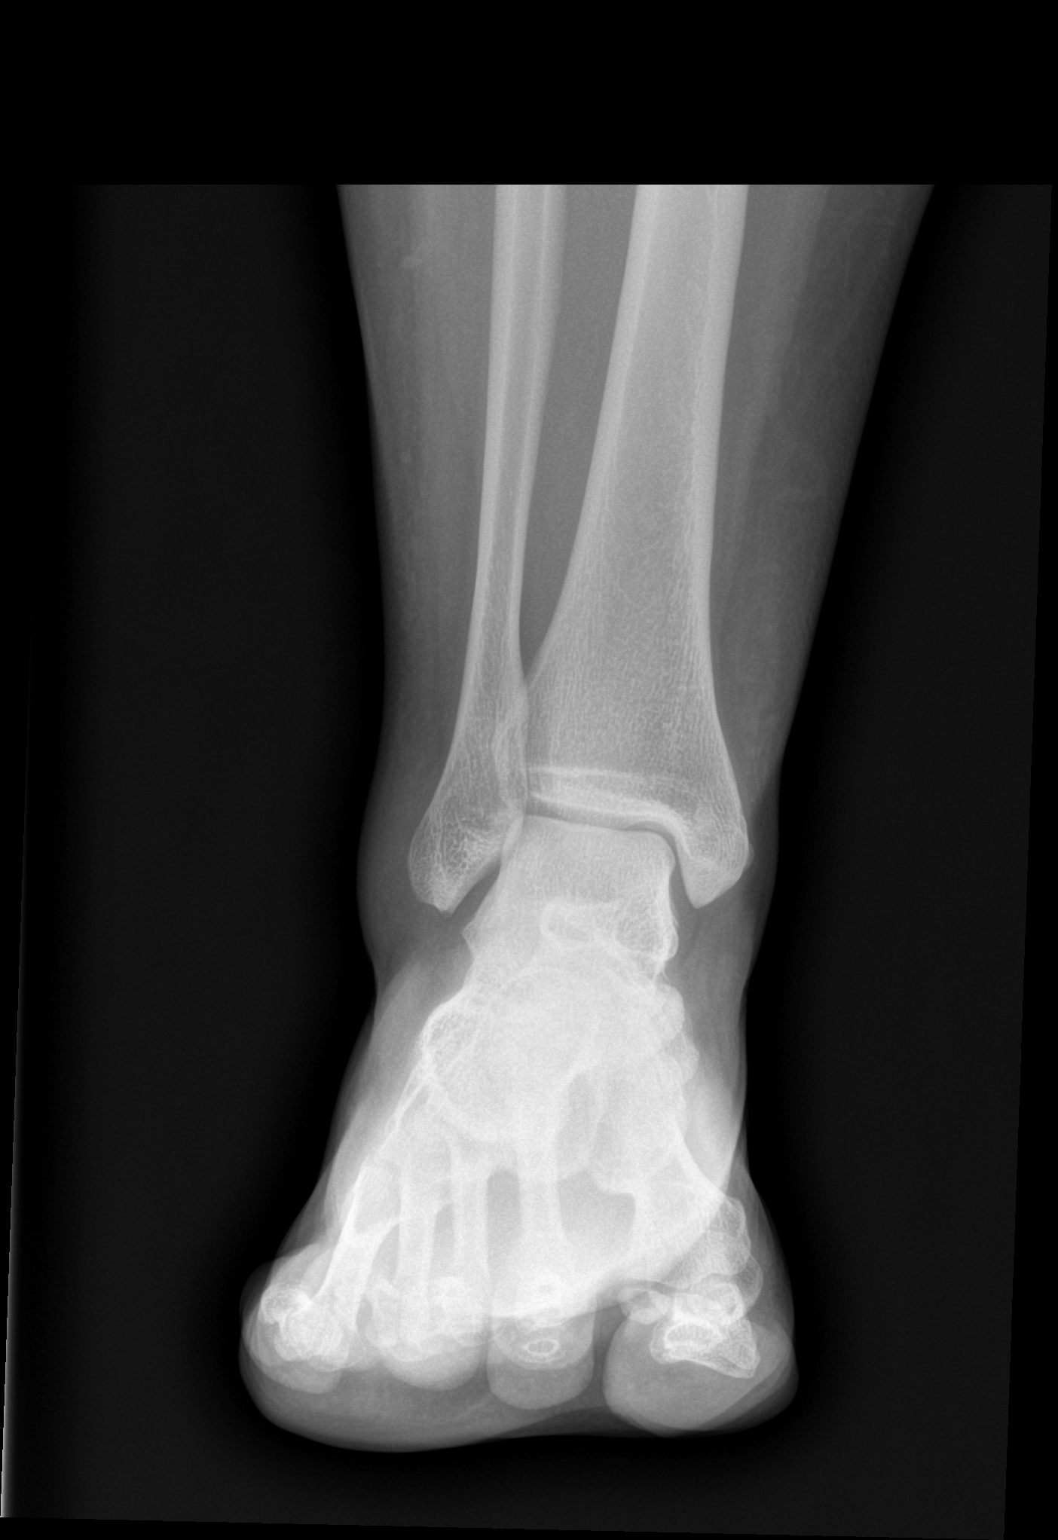

[ankle obl]
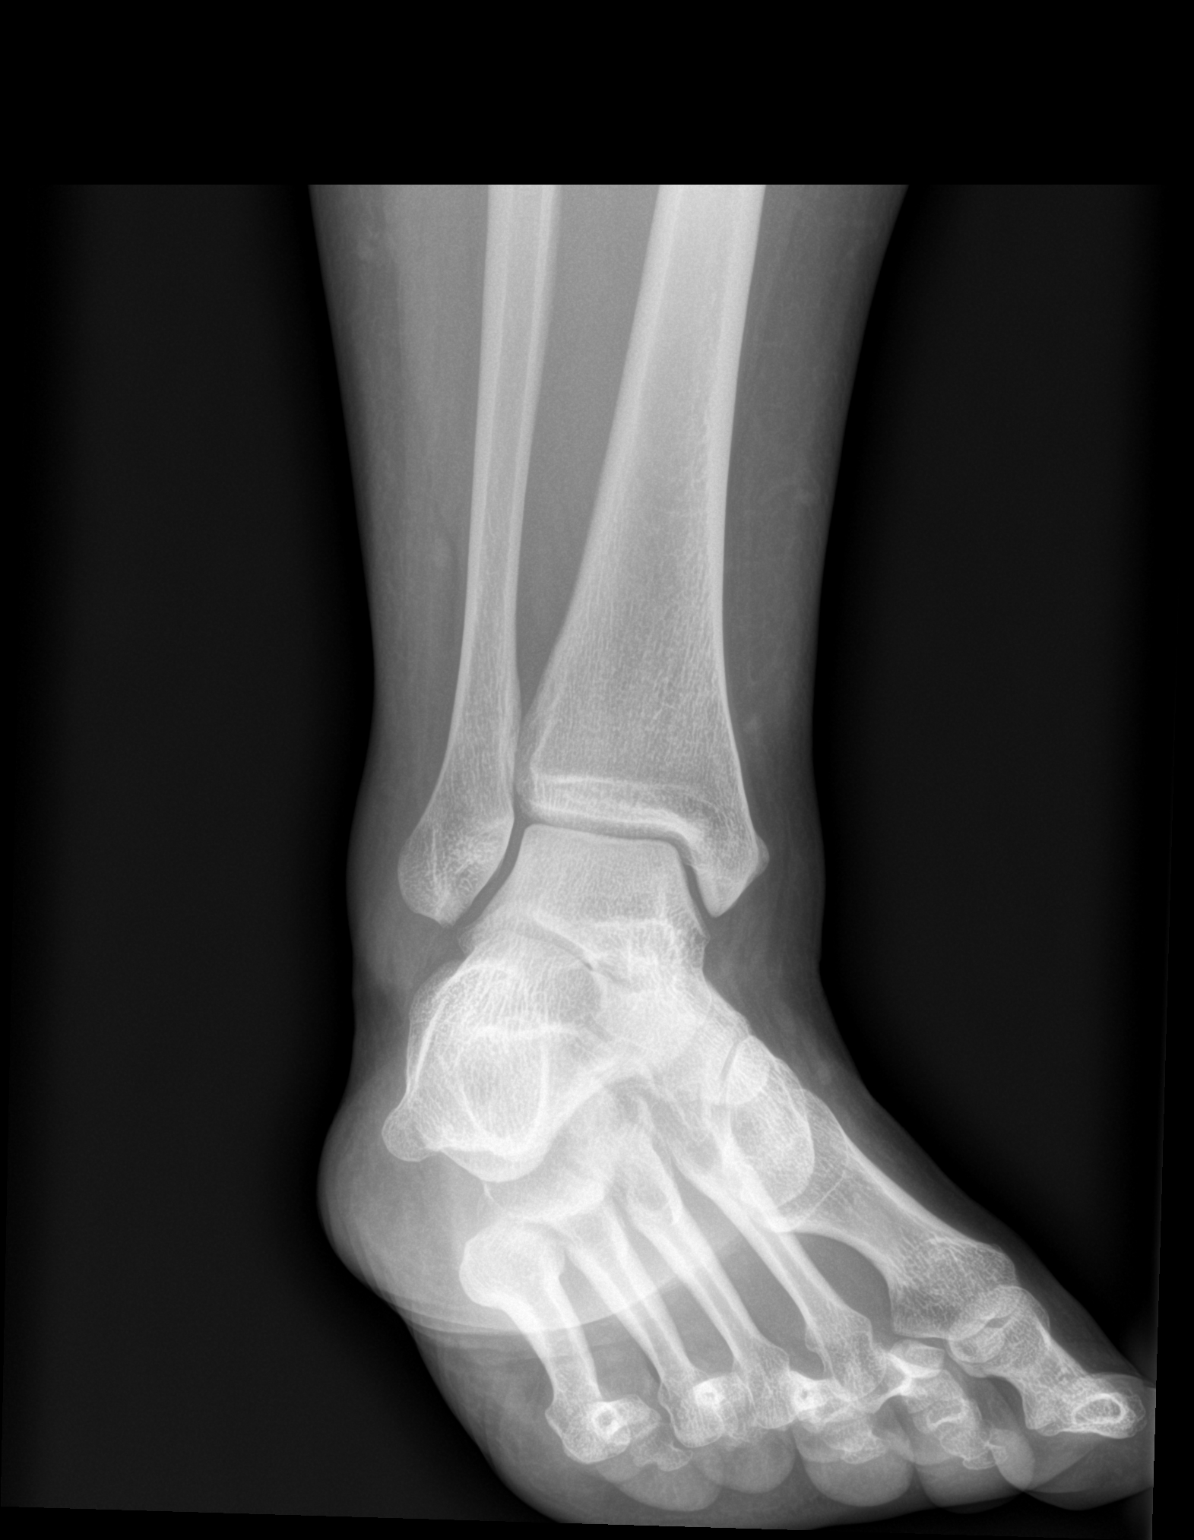

[ankle lat]
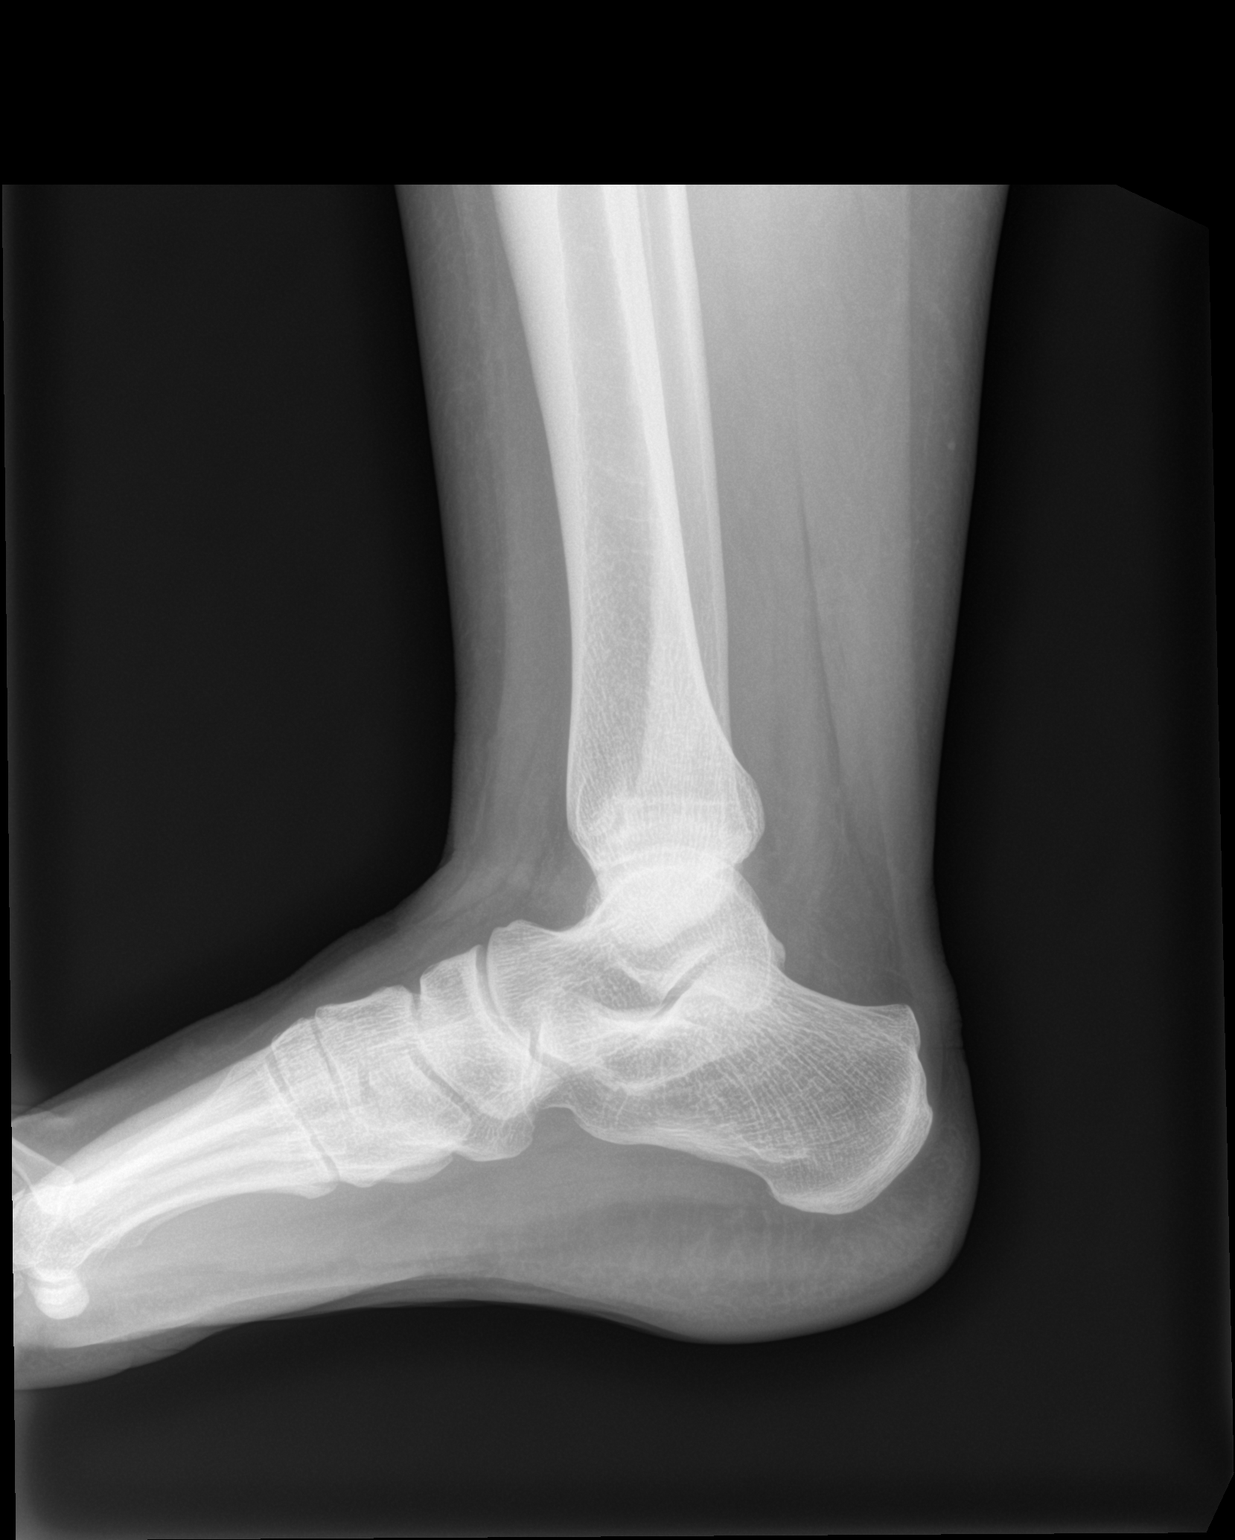

[3 of 3 positions shown; findings below may reference images not displayed]

FINDINGS: There is no evidence of fracture, dislocation, or joint effusion.
There is no evidence of arthropathy or other focal bone abnormality.
Moderate soft tissue swelling superficial to the lateral malleolus.
IMPRESSION: Moderate lateral soft tissue swelling. No acute fracture or
dislocation.
